# Patient Record
Sex: Male | Born: 1985 | Race: White | Hispanic: No | Marital: Single | Smoking: Never smoker
Health system: Southern US, Community
[De-identification: ages and names within clinical notes are randomized; demographics above are authoritative.]

---

## 2007-11-02 ENCOUNTER — Emergency Department (HOSPITAL_COMMUNITY): Admission: EM | Admit: 2007-11-02 | Discharge: 2007-11-02 | Payer: Self-pay | Admitting: Family Medicine

## 2016-04-10 ENCOUNTER — Emergency Department (HOSPITAL_COMMUNITY): Payer: Self-pay

## 2016-04-10 ENCOUNTER — Encounter (HOSPITAL_COMMUNITY): Payer: Self-pay | Admitting: Emergency Medicine

## 2016-04-10 ENCOUNTER — Emergency Department (HOSPITAL_COMMUNITY)
Admission: EM | Admit: 2016-04-10 | Discharge: 2016-04-10 | Disposition: A | Discharge status: Home / Self Care | Payer: Self-pay | Attending: Emergency Medicine | Admitting: Emergency Medicine

## 2016-04-10 DIAGNOSIS — M545 Low back pain, unspecified: Secondary | ICD-10-CM

## 2016-04-10 DIAGNOSIS — Y92410 Unspecified street and highway as the place of occurrence of the external cause: Secondary | ICD-10-CM | POA: Insufficient documentation

## 2016-04-10 DIAGNOSIS — Y999 Unspecified external cause status: Secondary | ICD-10-CM | POA: Insufficient documentation

## 2016-04-10 DIAGNOSIS — Y9389 Activity, other specified: Secondary | ICD-10-CM | POA: Insufficient documentation

## 2016-04-10 DIAGNOSIS — S3992XA Unspecified injury of lower back, initial encounter: Secondary | ICD-10-CM | POA: Insufficient documentation

## 2016-04-10 DIAGNOSIS — R55 Syncope and collapse: Secondary | ICD-10-CM | POA: Insufficient documentation

## 2016-04-10 LAB — PROTIME-INR
INR: 0.96
PROTHROMBIN TIME: 12.8 s (ref 11.4–15.2)

## 2016-04-10 LAB — COMPREHENSIVE METABOLIC PANEL
ALT: 50 U/L (ref 17–63)
AST: 36 U/L (ref 15–41)
Albumin: 4.3 g/dL (ref 3.5–5.0)
Alkaline Phosphatase: 88 U/L (ref 38–126)
Anion gap: 11 (ref 5–15)
BILIRUBIN TOTAL: 0.5 mg/dL (ref 0.3–1.2)
BUN: 10 mg/dL (ref 6–20)
CHLORIDE: 105 mmol/L (ref 101–111)
CO2: 24 mmol/L (ref 22–32)
CREATININE: 0.83 mg/dL (ref 0.61–1.24)
Calcium: 8.9 mg/dL (ref 8.9–10.3)
Glucose, Bld: 105 mg/dL — ABNORMAL HIGH (ref 65–99)
Potassium: 3.8 mmol/L (ref 3.5–5.1)
Sodium: 140 mmol/L (ref 135–145)
TOTAL PROTEIN: 7.7 g/dL (ref 6.5–8.1)

## 2016-04-10 LAB — I-STAT CHEM 8, ED
BUN: 10 mg/dL (ref 6–20)
CREATININE: 1 mg/dL (ref 0.61–1.24)
Calcium, Ion: 1.16 mmol/L (ref 1.15–1.40)
Chloride: 105 mmol/L (ref 101–111)
GLUCOSE: 103 mg/dL — AB (ref 65–99)
HCT: 41 % (ref 39.0–52.0)
HEMOGLOBIN: 13.9 g/dL (ref 13.0–17.0)
POTASSIUM: 3.8 mmol/L (ref 3.5–5.1)
Sodium: 143 mmol/L (ref 135–145)
TCO2: 24 mmol/L (ref 0–100)

## 2016-04-10 LAB — CK TOTAL AND CKMB (NOT AT ARMC)
CK TOTAL: 166 U/L (ref 49–397)
CK, MB: 1.8 ng/mL (ref 0.5–5.0)
Relative Index: 1.1 (ref 0.0–2.5)

## 2016-04-10 LAB — CBC
HCT: 39.8 % (ref 39.0–52.0)
Hemoglobin: 13.7 g/dL (ref 13.0–17.0)
MCH: 31 pg (ref 26.0–34.0)
MCHC: 34.4 g/dL (ref 30.0–36.0)
MCV: 90 fL (ref 78.0–100.0)
PLATELETS: 248 10*3/uL (ref 150–400)
RBC: 4.42 MIL/uL (ref 4.22–5.81)
RDW: 12.6 % (ref 11.5–15.5)
WBC: 15.7 10*3/uL — AB (ref 4.0–10.5)

## 2016-04-10 LAB — I-STAT CG4 LACTIC ACID, ED: LACTIC ACID, VENOUS: 1.67 mmol/L (ref 0.5–1.9)

## 2016-04-10 LAB — URINALYSIS, ROUTINE W REFLEX MICROSCOPIC
BILIRUBIN URINE: NEGATIVE
Glucose, UA: NEGATIVE mg/dL
Hgb urine dipstick: NEGATIVE
KETONES UR: 5 mg/dL — AB
LEUKOCYTES UA: NEGATIVE
NITRITE: NEGATIVE
PH: 5 (ref 5.0–8.0)
PROTEIN: NEGATIVE mg/dL
Specific Gravity, Urine: 1.023 (ref 1.005–1.030)

## 2016-04-10 LAB — ETHANOL: ALCOHOL ETHYL (B): 102 mg/dL — AB (ref <5)

## 2016-04-10 LAB — SAMPLE TO BLOOD BANK

## 2016-04-10 MED ORDER — METHOCARBAMOL 500 MG PO TABS: 500.0000 mg | ORAL_TABLET | Freq: Two times a day (BID) | ORAL | 0 refills | Status: AC | PRN

## 2016-04-10 MED ORDER — NAPROXEN 500 MG PO TABS: 500.0000 mg | ORAL_TABLET | Freq: Two times a day (BID) | ORAL | 0 refills | Status: AC

## 2016-04-10 MED ORDER — HYDROMORPHONE HCL 2 MG/ML IJ SOLN
1.0000 mg | Freq: Once | INTRAMUSCULAR | Status: AC
  Administered 2016-04-10: 1 mg via INTRAVENOUS
  Filled 2016-04-10: qty 1

## 2016-04-10 MED ORDER — IOPAMIDOL (ISOVUE-300) INJECTION 61%
INTRAVENOUS | Status: AC
  Administered 2016-04-10: 100 mL
  Filled 2016-04-10: qty 100

## 2016-04-10 MED ORDER — ONDANSETRON HCL 4 MG/2ML IJ SOLN
4.0000 mg | Freq: Once | INTRAMUSCULAR | Status: AC
  Administered 2016-04-10: 4 mg via INTRAVENOUS
  Filled 2016-04-10: qty 2

## 2016-04-10 MED ORDER — OXYCODONE-ACETAMINOPHEN 5-325 MG PO TABS: 1.0000 | ORAL_TABLET | ORAL | 0 refills | Status: AC | PRN
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
## 2016-04-10 NOTE — ED Notes (Signed)
Pt ambulated down hallway and tolerated well. Steady on feet/ Independent. Pt used restroom. Denies dizziness.
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
## 2016-04-10 NOTE — Discharge Instructions (Signed)

## 2016-04-10 NOTE — ED Notes (Signed)
ED Provider at bedside. 

## 2016-04-10 NOTE — ED Notes (Signed)
Pt A & O, speaking with [facility] at bedside.
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
## 2016-04-10 NOTE — ED Notes (Signed)
Patient transported to CT 
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
## 2021-07-06 DIAGNOSIS — Z0000 Encounter for general adult medical examination without abnormal findings: Secondary | ICD-10-CM | POA: Diagnosis not present

## 2021-07-06 DIAGNOSIS — Z131 Encounter for screening for diabetes mellitus: Secondary | ICD-10-CM | POA: Diagnosis not present

## 2021-07-06 DIAGNOSIS — Z202 Contact with and (suspected) exposure to infections with a predominantly sexual mode of transmission: Secondary | ICD-10-CM | POA: Diagnosis not present

## 2021-07-06 DIAGNOSIS — Z6832 Body mass index (BMI) 32.0-32.9, adult: Secondary | ICD-10-CM | POA: Diagnosis not present

## 2021-07-06 DIAGNOSIS — E669 Obesity, unspecified: Secondary | ICD-10-CM | POA: Diagnosis not present

## 2021-10-18 DIAGNOSIS — L500 Allergic urticaria: Secondary | ICD-10-CM | POA: Diagnosis not present

## 2021-10-18 DIAGNOSIS — T63441A Toxic effect of venom of bees, accidental (unintentional), initial encounter: Secondary | ICD-10-CM | POA: Diagnosis not present

## 2021-10-18 DIAGNOSIS — R0602 Shortness of breath: Secondary | ICD-10-CM | POA: Diagnosis not present

## 2022-05-09 DIAGNOSIS — R0981 Nasal congestion: Secondary | ICD-10-CM | POA: Diagnosis not present

## 2022-05-09 DIAGNOSIS — R070 Pain in throat: Secondary | ICD-10-CM | POA: Diagnosis not present

## 2022-06-06 DIAGNOSIS — R040 Epistaxis: Secondary | ICD-10-CM | POA: Diagnosis not present
